# Patient Record
Sex: Female | Born: 2000
Health system: Southern US, Community
[De-identification: ages and names within clinical notes are randomized; demographics above are authoritative.]

## PROBLEM LIST (undated history)

## (undated) DIAGNOSIS — F419 Anxiety disorder, unspecified: Secondary | ICD-10-CM

## (undated) DIAGNOSIS — F32A Depression, unspecified: Secondary | ICD-10-CM

## (undated) HISTORY — DX: Anxiety disorder, unspecified: F41.9

## (undated) HISTORY — DX: Depression, unspecified: F32.A

---
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
## 2022-01-07 ENCOUNTER — Other Ambulatory Visit: Payer: Self-pay

## 2022-01-07 ENCOUNTER — Other Ambulatory Visit (HOSPITAL_COMMUNITY): Payer: Self-pay

## 2022-04-07 ENCOUNTER — Other Ambulatory Visit: Payer: Self-pay

## 2022-04-07 ENCOUNTER — Other Ambulatory Visit (HOSPITAL_COMMUNITY): Payer: Self-pay

## 2022-04-08 ENCOUNTER — Other Ambulatory Visit (HOSPITAL_COMMUNITY): Payer: Self-pay

## 2022-04-11 DIAGNOSIS — S82392A Other fracture of lower end of left tibia, initial encounter for closed fracture: Secondary | ICD-10-CM | POA: Diagnosis not present

## 2022-04-11 DIAGNOSIS — M79672 Pain in left foot: Secondary | ICD-10-CM | POA: Diagnosis not present

## 2022-04-11 DIAGNOSIS — M25572 Pain in left ankle and joints of left foot: Secondary | ICD-10-CM | POA: Diagnosis not present

## 2022-04-25 DIAGNOSIS — M79672 Pain in left foot: Secondary | ICD-10-CM | POA: Diagnosis not present

## 2022-04-25 DIAGNOSIS — S82392A Other fracture of lower end of left tibia, initial encounter for closed fracture: Secondary | ICD-10-CM | POA: Diagnosis not present

## 2022-04-25 DIAGNOSIS — M25572 Pain in left ankle and joints of left foot: Secondary | ICD-10-CM | POA: Diagnosis not present

## 2022-05-25 DIAGNOSIS — S82392A Other fracture of lower end of left tibia, initial encounter for closed fracture: Secondary | ICD-10-CM | POA: Diagnosis not present

## 2022-06-27 DIAGNOSIS — S82392A Other fracture of lower end of left tibia, initial encounter for closed fracture: Secondary | ICD-10-CM | POA: Diagnosis not present

## 2022-07-13 DIAGNOSIS — M25572 Pain in left ankle and joints of left foot: Secondary | ICD-10-CM | POA: Diagnosis not present

## 2022-07-14 DIAGNOSIS — Z130 Encounter for screening for diseases of the blood and blood-forming organs and certain disorders involving the immune mechanism: Secondary | ICD-10-CM | POA: Diagnosis not present

## 2022-07-14 DIAGNOSIS — Z01411 Encounter for gynecological examination (general) (routine) with abnormal findings: Secondary | ICD-10-CM | POA: Diagnosis not present

## 2022-07-14 DIAGNOSIS — Z1389 Encounter for screening for other disorder: Secondary | ICD-10-CM | POA: Diagnosis not present

## 2022-07-14 DIAGNOSIS — Z113 Encounter for screening for infections with a predominantly sexual mode of transmission: Secondary | ICD-10-CM | POA: Diagnosis not present

## 2022-07-14 DIAGNOSIS — N939 Abnormal uterine and vaginal bleeding, unspecified: Secondary | ICD-10-CM | POA: Diagnosis not present

## 2022-07-18 ENCOUNTER — Other Ambulatory Visit (HOSPITAL_COMMUNITY): Payer: Self-pay

## 2022-07-18 MED ORDER — TRETINOIN 0.025 % EX CREA
TOPICAL_CREAM | CUTANEOUS | 2 refills | Status: AC
  Filled 2022-07-18: qty 45, 30d supply, fill #0

## 2022-07-18 MED ORDER — DRYSOL 20 % EX SOLN
CUTANEOUS | 0 refills | Status: AC
  Filled 2022-07-18: qty 35, 30d supply, fill #0

## 2022-08-16 ENCOUNTER — Other Ambulatory Visit (HOSPITAL_COMMUNITY): Payer: Self-pay

## 2022-08-29 ENCOUNTER — Other Ambulatory Visit (HOSPITAL_COMMUNITY): Payer: Self-pay

## 2022-11-23 ENCOUNTER — Other Ambulatory Visit: Payer: Self-pay | Admitting: Family Medicine

## 2022-11-23 DIAGNOSIS — F3340 Major depressive disorder, recurrent, in remission, unspecified: Secondary | ICD-10-CM

## 2022-11-23 DIAGNOSIS — F419 Anxiety disorder, unspecified: Secondary | ICD-10-CM

## 2022-12-17 ENCOUNTER — Other Ambulatory Visit: Payer: Self-pay | Admitting: Family Medicine

## 2022-12-17 DIAGNOSIS — F419 Anxiety disorder, unspecified: Secondary | ICD-10-CM

## 2022-12-21 ENCOUNTER — Other Ambulatory Visit: Payer: Self-pay | Admitting: Family Medicine

## 2022-12-21 DIAGNOSIS — F419 Anxiety disorder, unspecified: Secondary | ICD-10-CM

## 2022-12-21 DIAGNOSIS — F3340 Major depressive disorder, recurrent, in remission, unspecified: Secondary | ICD-10-CM

## 2022-12-22 ENCOUNTER — Telehealth: Payer: Self-pay | Admitting: Family Medicine

## 2022-12-22 DIAGNOSIS — F419 Anxiety disorder, unspecified: Secondary | ICD-10-CM

## 2022-12-22 DIAGNOSIS — F3340 Major depressive disorder, recurrent, in remission, unspecified: Secondary | ICD-10-CM

## 2022-12-22 NOTE — Telephone Encounter (Signed)
Pt called to request a refill of the sertraline (ZOLOFT) 100 MG tablet.  Pt has only seen MD one time, 11/16/21. Pt was offered an OV, since she has not seen MD in over a year.  Pt stated she cannot come in because she is away at college. Pt asked if she can have a VV? Please advise.

## 2022-12-23 MED ORDER — SERTRALINE HCL 100 MG PO TABS: 100.0000 mg | ORAL_TABLET | Freq: Every day | ORAL | 0 refills | Status: DC

## 2022-12-23 NOTE — Telephone Encounter (Signed)
Pt states her college is in [place], [place].  Pt was scheduled for a VV on Monday, 12/26/22.

## 2022-12-23 NOTE — Telephone Encounter (Signed)
Has to be in [place] for a virtual visit. If she is, okay to schedule.

## 2022-12-26 ENCOUNTER — Telehealth (INDEPENDENT_AMBULATORY_CARE_PROVIDER_SITE_OTHER): Payer: Commercial Managed Care - PPO | Admitting: Family Medicine

## 2022-12-26 ENCOUNTER — Encounter: Payer: Self-pay | Admitting: Family Medicine

## 2022-12-26 VITALS — Ht 63.35 in

## 2022-12-26 DIAGNOSIS — F419 Anxiety disorder, unspecified: Secondary | ICD-10-CM

## 2022-12-26 DIAGNOSIS — F3340 Major depressive disorder, recurrent, in remission, unspecified: Secondary | ICD-10-CM

## 2022-12-26 MED ORDER — SERTRALINE HCL 50 MG PO TABS: ORAL_TABLET | ORAL | 1 refills | Status: DC

## 2022-12-26 MED ORDER — BUSPIRONE HCL 10 MG PO TABS: 10.0000 mg | ORAL_TABLET | Freq: Every day | ORAL | 3 refills | Status: DC

## 2022-12-26 NOTE — Assessment & Plan Note (Signed)
In general she is feeling like problem has been well controlled and ready to try to wean off Sertraline. She was instructed to decrease Sertraline from 100 mg to 75 mg daily for 4 weeks then alternate between 75 mg and 50 gm for 4 weeks then 50 mg daily. I sent a new prescription for Sertraline 50 mg. She will let me know how she is doing with new dose in about 3 months. Continue BuSpar 10 mg daily for now. Follow-up in a year, before if needed.

## 2022-12-26 NOTE — Progress Notes (Signed)
Virtual Visit via Video Note I connected with Lori Massey on 12/26/2022 by a video enabled telemedicine application and verified that I am speaking with the correct person using two identifiers. Location patient: home Location provider:work office Persons participating in the virtual visit: patient, scribe,provider  I discussed the limitations of evaluation and management by telemedicine and the availability of in person appointments. The patient expressed understanding and agreed to proceed.  Chief Complaint  Patient presents with   Medical Management of Chronic Issues   HPI: Lori Massey is a 22 y.o. female with a PMHx significant for anxiety, and depression who is being seen on video today for follow up.  She was last seen on 11/16/21. No new problems since her last visit.  Anxiety and depression:  Currently on sertraline 100 mg daily and Buspar 10 mg daily.  She says they are still helping.  She has been taking the sertraline for about 5 years, and in interested in decreasing her dose She denies any side effects.  She reports she is sleeping ~10 hours per night.   Negative for fever,chills,changes in appetite, CP,SOB,palpitations, abdominal pain,N/V,or urinary symptoms.  Attending school in [place], major in business.  ROS: See pertinent positives and negatives per HPI.  Past Medical History:  Diagnosis Date   Anxiety    Depression    History reviewed. No pertinent surgical history.  Family History  Problem Relation Age of Onset   Anxiety disorder Mother    Hypertension Mother     Social History   Socioeconomic History   Marital status: Single    Spouse name: Not on file   Number of children: Not on file   Years of education: Not on file   Highest education level: Not on file  Occupational History   Not on file  Tobacco Use   Smoking status: Never   Smokeless tobacco: Never  Substance and Sexual Activity   Alcohol use: No   Drug use:  No   Sexual activity: Yes    Birth control/protection: Implant  Other Topics Concern   Not on file  Social History Narrative   Not on file   Social Determinants of Health   Financial Resource Strain: Not on file  Food Insecurity: Not on file  Transportation Needs: Not on file  Physical Activity: Not on file  Stress: Not on file  Social Connections: Not on file  Intimate Partner Violence: Not on file    Current Outpatient Medications:    aluminum chloride (DRYSOL) 20 % external solution, Apply once daily at bedtime as directed, Disp: 35 mL, Rfl: 0   fluticasone (FLONASE) 50 MCG/ACT nasal spray, Place 2 sprays into both nostrils daily., Disp: 16 g, Rfl: 0   sertraline (ZOLOFT) 50 MG tablet, 1.5 tabs daily, Disp: 90 tablet, Rfl: 1   tretinoin (RETIN-A) 0.025 % cream, Apply to face once daily in the evening, Disp: 45 g, Rfl: 2   busPIRone (BUSPAR) 10 MG tablet, Take 1 tablet (10 mg total) by mouth daily., Disp: 90 tablet, Rfl: 3  EXAM:  VITALS per patient if applicable:Ht 5' 3.35" (1.609 m)   LMP 12/19/2022   BMI 22.86 kg/m   GENERAL: alert, oriented, appears well and in no acute distress  HEENT: atraumatic, conjunctiva clear, no obvious abnormalities on inspection of external nose and ears  NECK: normal movements of the head and neck  LUNGS: on inspection no signs of respiratory distress, breathing rate appears normal, no obvious gross SOB, gasping  or wheezing  CV: no obvious cyanosis  MS: moves all visible extremities without noticeable abnormality  PSYCH/NEURO: pleasant and cooperative, no obvious depression or anxiety, speech and thought processing grossly intact  ASSESSMENT AND PLAN:  Discussed the following assessment and plan:  Depression, major, recurrent, in remission [facility]) Assessment & Plan: In general she is feeling like problem has been well controlled and ready to try to wean off Sertraline. She was instructed to decrease Sertraline from 100 mg to 75 mg  daily for 4 weeks then alternate between 75 mg and 50 gm for 4 weeks then 50 mg daily. I sent a new prescription for Sertraline 50 mg. She will let me know how she is doing with new dose in about 3 months. Continue BuSpar 10 mg daily for now. Follow-up in a year, before if needed.  Orders: -     busPIRone HCl; Take 1 tablet (10 mg total) by mouth daily.  Dispense: 90 tablet; Refill: 3 -     Sertraline HCl; 1.5 tabs daily  Dispense: 90 tablet; Refill: 1  Anxiety disorder, unspecified type Assessment & Plan: Problem is well-controlled. Sertraline dose change, she is going to try to titrate medication down gradually as tolerated to 50 mg daily. Continue BuSpar 10 mg daily. Follow-up in a year, before if needed.  Orders: -     busPIRone HCl; Take 1 tablet (10 mg total) by mouth daily.  Dispense: 90 tablet; Refill: 3 -     Sertraline HCl; 1.5 tabs daily  Dispense: 90 tablet; Refill: 1   We discussed possible serious and likely etiologies, options for evaluation and workup, limitations of telemedicine visit vs in person visit, treatment, treatment risks and precautions. The patient was advised to call back or seek an in-person evaluation if the symptoms worsen or if the condition fails to improve as anticipated. I discussed the assessment and treatment plan with the patient. The patient was provided an opportunity to ask questions and all were answered. The patient agreed with the plan and demonstrated an understanding of the instructions.  Return in about 1 year (around 12/26/2023) for chronic problems.  I, Rolla Etienne Wierda, acting as a scribe for [name], MD., have documented all relevant documentation on the behalf of [name], MD, as directed by  [name], MD while in the presence of [name], MD.   I, [name], MD, have reviewed all documentation for this visit. The documentation on 12/26/22 for the exam, diagnosis, procedures, and orders are all accurate and  complete.  [name], MD

## 2022-12-26 NOTE — Assessment & Plan Note (Addendum)
Problem is well-controlled. Sertraline dose change, she is going to try to titrate medication down gradually as tolerated to 50 mg daily. Continue BuSpar 10 mg daily. Follow-up in a year, before if needed.

## 2023-01-21 ENCOUNTER — Other Ambulatory Visit: Payer: Self-pay | Admitting: Family Medicine

## 2023-01-21 DIAGNOSIS — F3340 Major depressive disorder, recurrent, in remission, unspecified: Secondary | ICD-10-CM

## 2023-01-21 DIAGNOSIS — F419 Anxiety disorder, unspecified: Secondary | ICD-10-CM

## 2023-02-27 ENCOUNTER — Other Ambulatory Visit: Payer: Self-pay

## 2023-02-27 DIAGNOSIS — F3340 Major depressive disorder, recurrent, in remission, unspecified: Secondary | ICD-10-CM

## 2023-02-27 DIAGNOSIS — F419 Anxiety disorder, unspecified: Secondary | ICD-10-CM

## 2023-02-27 MED ORDER — SERTRALINE HCL 50 MG PO TABS: ORAL_TABLET | ORAL | 1 refills | Status: DC

## 2023-06-07 ENCOUNTER — Other Ambulatory Visit (HOSPITAL_COMMUNITY): Payer: Self-pay

## 2023-06-07 MED ORDER — ETONOGESTREL-ETHINYL ESTRADIOL 0.12-0.015 MG/24HR VA RING
VAGINAL_RING | VAGINAL | 0 refills | Status: DC
Start: 2023-06-07 — End: 2023-09-19
  Filled 2023-06-07: qty 1, 28d supply, fill #0

## 2023-06-20 ENCOUNTER — Other Ambulatory Visit (HOSPITAL_COMMUNITY): Payer: Self-pay

## 2023-07-07 ENCOUNTER — Encounter: Payer: Self-pay | Admitting: Family Medicine

## 2023-07-10 ENCOUNTER — Encounter: Payer: Self-pay | Admitting: Family Medicine

## 2023-07-10 ENCOUNTER — Telehealth: Payer: Self-pay

## 2023-07-10 NOTE — Telephone Encounter (Signed)
 Copied from CRM [id number]. Topic: Clinical - Medical Advice >> Jul 10, 2023  1:06 PM Thersia BROCKS wrote: Reason for CRM: Patient called in regarding a message she has sent through [facility] , would like the nurses to give her a callback regarding this

## 2023-09-17 ENCOUNTER — Other Ambulatory Visit: Payer: Self-pay | Admitting: Family Medicine

## 2023-09-17 DIAGNOSIS — F419 Anxiety disorder, unspecified: Secondary | ICD-10-CM

## 2023-09-17 DIAGNOSIS — F3340 Major depressive disorder, recurrent, in remission, unspecified: Secondary | ICD-10-CM

## 2023-09-19 ENCOUNTER — Telehealth: Admitting: Family Medicine

## 2023-09-19 VITALS — Ht 63.35 in

## 2023-09-19 DIAGNOSIS — R41840 Attention and concentration deficit: Secondary | ICD-10-CM | POA: Diagnosis not present

## 2023-09-19 DIAGNOSIS — F3340 Major depressive disorder, recurrent, in remission, unspecified: Secondary | ICD-10-CM

## 2023-09-19 DIAGNOSIS — F419 Anxiety disorder, unspecified: Secondary | ICD-10-CM

## 2023-09-19 MED ORDER — SERTRALINE HCL 50 MG PO TABS
50.0000 mg | ORAL_TABLET | Freq: Every day | ORAL | Status: AC
Start: 2023-09-19 — End: ?

## 2023-09-19 NOTE — Progress Notes (Signed)
 Virtual Visit via Video Note I connected with Lori Massey on 09/19/23 by a video enabled telemedicine application and verified that I am speaking with the correct person using two identifiers. Location patient: home Location provider:work office Persons participating in the virtual visit: patient, provider  I discussed the limitations of evaluation and management by telemedicine and the availability of in person appointments. The patient expressed understanding and agreed to proceed.  Chief Complaint  Patient presents with   trouble focusing   Lori Massey is a 23 year old female with PMHx significant for anxiety and depression who is being seen today virtually with concerns about concentration difficulties.  She has been experiencing concentration issues, which have become more pressing as she prepares for a big test. She has not been screened or treated for ADHD previously and has not seen a psychiatrist before.  She was last seen on 12/26/2022, when we discussed the possibility of restarting weaning of sertraline . She is currently taking sertraline  50 mg daily , which she reduced from 100 mg, and buspirone  10 mg daily. She feels that her depression and anxiety are well controlled with these medications. She has been on pharmacologic treatment for depression and anxiety for about 6 years.  She reports sleeping an average of nine to ten hours per night.  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   ROS: See pertinent positives and negatives per HPI.  Past Medical History:  Diagnosis Date   Anxiety    Depression    History reviewed. No pertinent surgical history.  Family History  Problem Relation Age of Onset   Anxiety disorder Mother    Hypertension Mother    Social History   Socioeconomic History   Marital status: Single    Spouse name: Not on file   Number of children: Not on file   Years of education: Not on file    Highest education level: Some college, no degree  Occupational History   Not on file  Tobacco Use   Smoking status: Never   Smokeless tobacco: Never  Substance and Sexual Activity   Alcohol use: No   Drug use: No   Sexual activity: Yes    Birth control/protection: Implant  Other Topics Concern   Not on file  Social History Narrative   Not on file   Social Drivers of Health   Financial Resource Strain: Low Risk  (09/19/2023)   Overall Financial Resource Strain (CARDIA)    Difficulty of Paying Living Expenses: Not hard at all  Food Insecurity: No Food Insecurity (09/19/2023)   Hunger Vital Sign    Worried About Running Out of Food in the Last Year: Never true    Ran Out of Food in the Last Year: Never true  Transportation Needs: No Transportation Needs (09/19/2023)   PRAPARE - Administrator, Civil Service (Medical): No    Lack of Transportation (Non-Medical): No  Physical Activity: Sufficiently Active (09/19/2023)   Exercise Vital Sign    Days of Exercise per Week: 7 days    Minutes of Exercise per Session: 60 min  Stress: No Stress Concern Present (09/19/2023)   [employer] of Occupational Health - Occupational Stress Questionnaire    Feeling of Stress: Not at all  Social Connections: Moderately Integrated (09/19/2023)   Social Connection and Isolation Panel    Frequency of Communication with Friends and Family: More than three times a week    Frequency of Social Gatherings with  Friends and Family: More than three times a week    Attends Religious Services: More than 4 times per year    Active Member of Clubs or Organizations: Yes    Attends Banker Meetings: 1 to 4 times per year    Marital Status: Never married  Intimate Partner Violence: Not At Risk (09/09/2023)   Received from [facility]   HITS    Over the last 12 months how often did your partner physically hurt you?: Never    Over the last 12 months how often did your partner insult you or  talk down to you?: Never    Over the last 12 months how often did your partner threaten you with physical harm?: Never    Over the last 12 months how often did your partner scream or curse at you?: Never     Current Outpatient Medications:    aluminum  chloride (DRYSOL) 20 % external solution, Apply once daily at bedtime as directed, Disp: 35 mL, Rfl: 0   busPIRone  (BUSPAR ) 10 MG tablet, Take 1 tablet (10 mg total) by mouth daily., Disp: 90 tablet, Rfl: 3   fluticasone  (FLONASE ) 50 MCG/ACT nasal spray, Place 2 sprays into both nostrils daily., Disp: 16 g, Rfl: 0   tretinoin  (RETIN-A ) 0.025 % cream, Apply to face once daily in the evening, Disp: 45 g, Rfl: 2   sertraline  (ZOLOFT ) 50 MG tablet, Take 1 tablet (50 mg total) by mouth daily. TAKE 1.5 TABLETS BY MOUTH DAILY, Disp: , Rfl:   EXAM:  VITALS per patient if applicable:Ht 5' 3.35 (1.609 m)   BMI 22.86 kg/m   GENERAL: alert, oriented, appears well and in no acute distress  HEENT: atraumatic, conjunctiva clear, no obvious abnormalities on inspection of external nose and ears  NECK: normal movements of the head and neck  LUNGS: on inspection no signs of respiratory distress, breathing rate appears normal, no obvious gross SOB, gasping or wheezing  CV: no obvious cyanosis  MS: moves all visible extremities without noticeable abnormality  PSYCH/NEURO: pleasant and cooperative, no obvious depression or anxiety, speech and thought processing grossly intact  ASSESSMENT AND PLAN:  Discussed the following assessment and plan:  Difficulty concentrating We discussed possible etiologies, chronic comorbidities could be contributing factors (anxiety and depression) as well as undiagnosed ADD/ADHD.We briefly discussed treatment options but she needs to be screened for these conditions. We discussed options, including establishing with the ADD/ADHD clinic here in town or psychiatry referral, she agrees with the latter one.  -      Ambulatory referral to Psychiatry  Anxiety disorder, unspecified type Assessment & Plan: Problem is well-controlled. She has decreased sertraline  from 100 to 50 mg daily and also on BuSpar  10 mg daily. No changes today.  Orders: -     Ambulatory referral to Psychiatry -     Sertraline  HCl; Take 1 tablet (50 mg total) by mouth daily. TAKE 1.5 TABLETS BY MOUTH DAILY  Depression, major, recurrent, in remission [facility]) Assessment & Plan: She reports problem is well-controlled with current regimen. Continue BuSpar  10 mg daily and sertraline  50 mg daily.  Orders: -     Ambulatory referral to Psychiatry -     Sertraline  HCl; Take 1 tablet (50 mg total) by mouth daily. TAKE 1.5 TABLETS BY MOUTH DAILY   We discussed possible serious and likely etiologies, options for evaluation and workup, limitations of telemedicine visit vs in person visit, treatment, treatment risks and precautions. The patient was advised to call  back or seek an in-person evaluation if the symptoms worsen or if the condition fails to improve as anticipated. I discussed the assessment and treatment plan with the patient. The patient was provided an opportunity to ask questions and all were answered. The patient agreed with the plan and demonstrated an understanding of the instructions.  Return if symptoms worsen or fail to improve, for keep next appointment.  Xavien Dauphinais [place], MD

## 2023-09-19 NOTE — Assessment & Plan Note (Addendum)
 Problem is well-controlled. She has decreased sertraline  from 100 to 50 mg daily and also on BuSpar  10 mg daily. No changes today.

## 2023-09-19 NOTE — Assessment & Plan Note (Signed)
 She reports problem is well-controlled with current regimen. Continue BuSpar  10 mg daily and sertraline  50 mg daily.

## 2023-12-31 ENCOUNTER — Other Ambulatory Visit: Payer: Self-pay | Admitting: Family Medicine

## 2023-12-31 DIAGNOSIS — F419 Anxiety disorder, unspecified: Secondary | ICD-10-CM

## 2023-12-31 DIAGNOSIS — F3340 Major depressive disorder, recurrent, in remission, unspecified: Secondary | ICD-10-CM
